# Patient Record
Sex: Female | Born: 1985 | Race: Black or African American | Hispanic: No | Marital: Single | Smoking: Never smoker
Health system: Southern US, Community
[De-identification: ages and names within clinical notes are randomized; demographics above are authoritative.]

## PROBLEM LIST (undated history)

## (undated) DIAGNOSIS — L0291 Cutaneous abscess, unspecified: Secondary | ICD-10-CM

## (undated) HISTORY — PX: TONSILLECTOMY: SUR1361

---
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
## 2007-01-02 ENCOUNTER — Emergency Department: Payer: Self-pay | Admitting: Emergency Medicine

## 2007-12-28 ENCOUNTER — Emergency Department: Payer: Self-pay | Admitting: Emergency Medicine

## 2009-11-05 ENCOUNTER — Emergency Department: Payer: Self-pay | Admitting: Emergency Medicine

## 2009-11-08 ENCOUNTER — Emergency Department: Payer: Self-pay | Admitting: Emergency Medicine

## 2010-04-07 ENCOUNTER — Emergency Department: Payer: Self-pay | Admitting: Emergency Medicine

## 2010-06-27 ENCOUNTER — Emergency Department: Payer: Self-pay | Admitting: Emergency Medicine

## 2011-07-02 ENCOUNTER — Emergency Department: Payer: Self-pay | Admitting: *Deleted

## 2011-07-02 LAB — URINALYSIS, COMPLETE
Ketone: NEGATIVE
Nitrite: NEGATIVE
Ph: 6 (ref 4.5–8.0)
Protein: 100
RBC,UR: 146 /HPF (ref 0–5)
Specific Gravity: 1.013 (ref 1.003–1.030)
WBC UR: 362 /HPF (ref 0–5)

## 2012-03-17 ENCOUNTER — Observation Stay: Payer: Self-pay | Admitting: Surgery

## 2012-03-17 LAB — COMPREHENSIVE METABOLIC PANEL
Albumin: 3.6 g/dL (ref 3.4–5.0)
Alkaline Phosphatase: 102 U/L (ref 50–136)
Anion Gap: 8 (ref 7–16)
BUN: 11 mg/dL (ref 7–18)
Bilirubin,Total: 0.5 mg/dL (ref 0.2–1.0)
Chloride: 105 mmol/L (ref 98–107)
Creatinine: 0.81 mg/dL (ref 0.60–1.30)
EGFR (African American): >60
Glucose: 76 mg/dL (ref 65–99)
Osmolality: 276 (ref 275–301)
Potassium: 3.9 mmol/L (ref 3.5–5.1)
SGOT(AST): 17 U/L (ref 15–37)
Sodium: 139 mmol/L (ref 136–145)
Total Protein: 8.9 g/dL — ABNORMAL HIGH (ref 6.4–8.2)

## 2012-03-17 LAB — URINALYSIS, COMPLETE
Bilirubin,UR: NEGATIVE
Nitrite: NEGATIVE
Ph: 6 (ref 4.5–8.0)
Protein: 30
Specific Gravity: 1.029 (ref 1.003–1.030)
Squamous Epithelial: 17

## 2012-03-17 LAB — CBC
HCT: 39.9 % (ref 35.0–47.0)
HGB: 13 g/dL (ref 12.0–16.0)
MCH: 27.7 pg (ref 26.0–34.0)
MCV: 85 fL (ref 80–100)
Platelet: 405 10*3/uL (ref 150–440)
RBC: 4.69 10*6/uL (ref 3.80–5.20)
RDW: 12.7 % (ref 11.5–14.5)
WBC: 21.3 10*3/uL — ABNORMAL HIGH (ref 3.6–11.0)

## 2012-03-17 LAB — PREGNANCY, URINE: Pregnancy Test, Urine: NEGATIVE m[IU]/mL

## 2012-03-17 LAB — PROTIME-INR: INR: 1.1

## 2013-03-22 ENCOUNTER — Inpatient Hospital Stay: Payer: Self-pay | Admitting: Surgery

## 2013-03-22 LAB — CBC WITH DIFFERENTIAL/PLATELET
Basophil %: 0.2 %
Eosinophil #: 0 10*3/uL (ref 0.0–0.7)
Eosinophil %: 0 %
HCT: 37.8 % (ref 35.0–47.0)
HGB: 12.5 g/dL (ref 12.0–16.0)
Lymphocyte #: 1.7 10*3/uL (ref 1.0–3.6)
Lymphocyte %: 5.5 %
Monocyte #: 3.5 x10 3/mm — ABNORMAL HIGH (ref 0.2–0.9)
Neutrophil #: 26.5 10*3/uL — ABNORMAL HIGH (ref 1.4–6.5)
Neutrophil %: 83.3 %
Platelet: 287 10*3/uL (ref 150–440)
RDW: 13.1 % (ref 11.5–14.5)
WBC: 31.8 10*3/uL — ABNORMAL HIGH (ref 3.6–11.0)

## 2013-03-22 LAB — COMPREHENSIVE METABOLIC PANEL
Alkaline Phosphatase: 102 U/L (ref 50–136)
Anion Gap: 7 (ref 7–16)
Chloride: 103 mmol/L (ref 98–107)
Creatinine: 1.08 mg/dL (ref 0.60–1.30)
EGFR (African American): >60
EGFR (Non-African Amer.): >60
Osmolality: 269 (ref 275–301)
Potassium: 3.3 mmol/L — ABNORMAL LOW (ref 3.5–5.1)
Sodium: 136 mmol/L (ref 136–145)
Total Protein: 8.3 g/dL — ABNORMAL HIGH (ref 6.4–8.2)

## 2013-03-26 LAB — WOUND CULTURE

## 2013-03-27 LAB — CULTURE, BLOOD (SINGLE)

## 2013-05-14 ENCOUNTER — Emergency Department: Payer: Self-pay | Admitting: Emergency Medicine

## 2014-01-18 ENCOUNTER — Emergency Department: Payer: Self-pay | Admitting: Emergency Medicine

## 2014-01-18 LAB — TROPONIN I

## 2014-05-23 ENCOUNTER — Emergency Department: Payer: Self-pay | Admitting: Emergency Medicine

## 2014-05-25 ENCOUNTER — Emergency Department: Payer: Self-pay | Admitting: Emergency Medicine

## 2014-09-13 NOTE — Op Note (Signed)
PATIENT NAME:  Emily Martinez, Emily Martinez MR#:  [id number] DATE OF BIRTH:  1985-07-29  DATE OF PROCEDURE:  03/17/2012  PREOPERATIVE DIAGNOSIS: Perirectal abscess.   POSTOPERATIVE DIAGNOSIS: Perirectal abscess.   PROCEDURE: Incision and drainage of a left perirectal abscess.   SURGEON: Elain Wixon E. Excell Seltzerooper, MD    ANESTHESIA: General with LMA.   INDICATIONS: This is a patient with a progressively larger and increasingly painful mass in the perirectal area on the left. Preoperatively we discussed the rationale for surgery, the options of observation, risks of bleeding, infection, recurrence, open wound, and drain placement. She understood and agreed to proceed.   FINDINGS: Large perirectal abscess on the left buttock.   DESCRIPTION OF PROCEDURE: The patient was induced to general anesthesia and placed into a high candy cane-type lithotomy position and then prepped and draped in a sterile fashion. Examination was performed under anesthesia demonstrating a large perirectal abscess on the left buttock. An incision was made after surgical pause was performed. Purulence exuded. Cultures were taken. Suction was performed to aspirate all the purulence out and it was clearly communicating with the anterior rectum. A Penrose drain was placed into the cavity and held in with a 3-0 Prolene suture and sterile dressings were placed.   The patient tolerated the procedure well. There were no complications. She was taken to the recovery room in stable condition to be admitted for continued care.   ____________________________ Adah Salvageichard E. Excell Seltzerooper, MD rec:drc D: 03/17/2012 23:52:43 ET T: 03/18/2012 08:19:05 ET JOB#: [id number]  cc: Adah Salvageichard E. Excell Seltzerooper, MD, <Dictator> Lattie HawICHARD E Markcus Lazenby MD ELECTRONICALLY SIGNED 03/18/2012 19:17

## 2014-09-13 NOTE — H&P (Signed)
    Subjective/Chief Complaint Fevers, malaise, left buttocks abscess    History of Present Illness Emily Martinez is a pleasant 29 yo F who presents with 5 days of left buttock pain.  She says that she began noticing a slight discomfort to her left buttocks last Thursday.  Since then she has noticed it more significantly.  She says that she now feels a hard mass with a softening in the middle.  No drainage.  Has been having fevers as high as 102.5 which has improved with tylenol.  Has had overall malaise over the weekend.  Able to keep down liquids but has not been taking PO solids.  Last PO liquids approx 4 hours ago.  No chills, night sweats, shortness of breath, cough, abdominal pain, nausea/vomiting, diarrhea/constipation, dysuria/hematuria.  Has never had an abscess before    Past History S/p tonsillectomy   Past Med/Surgical Hx:  Denies medical history:   Tonsillectomy:   ALLERGIES:  NKDA: None    Other Allergies NKDA     Medications Tylenol prn for pain   Family and Social History:   Family History Hypertension  Diabetes Mellitus  Cancer  Breast cancer in maternal GGM, Aunts    Social History negative tobacco, negative ETOH, negative Illicit drugs   Review of Systems:   Subjective/Chief Complaint Perianal/buttock pain, fevers, malaise    Fever/Chills Yes    Cough No    Sputum No    Abdominal Pain No    Diarrhea No    Constipation No    Nausea/Vomiting No    SOB/DOE No    Chest Pain No    Dysuria No    Tolerating Diet Malaise   Physical Exam:   GEN well developed, no acute distress    HEENT pink conjunctivae, PERRL    NECK supple    RESP normal resp effort  clear BS  no use of accessory muscles    CARD regular rate  no murmur  no thrills    ABD denies tenderness  denies Flank Tenderness  no hernia  normal BS  no Adominal Mass    EXTR negative cyanosis/clubbing, negative edema    SKIN normal to palpation, No rashes, No ulcers    PSYCH A+O to time,  place, person, good insight    Additional Comments Large approx 5 x 5 area of induration/erythema, some superficial epidermolysis to medial left buttocks, inferior.  Tender to palpation.  + area of fluctuance in middle.  No spontaneous drainage.     Assessment/Admission Diagnosis 29 yo F with perianal/buttocks abscess, pain, fevers, malaise.    Plan Plan for OR for incision and drainage of buttocks/perianal abscess.  Informed consent obtained.  Given Unasyn x 1 dose.  Will decide if d/c to home vs admission postoperatively.   Electronic Signatures: Jarvis NewcomerLundquist, Quintasia Theroux A (MD)  (Signed 22-Oct-13 16:30)  Authored: CHIEF COMPLAINT and HISTORY, PAST MEDICAL/SURGIAL HISTORY, ALLERGIES, Other Allergies, OTHER MEDICATIONS, FAMILY AND SOCIAL HISTORY, REVIEW OF SYSTEMS, PHYSICAL EXAM, ASSESSMENT AND PLAN   Last Updated: 22-Oct-13 16:30 by Jarvis NewcomerLundquist, Nickolai Rinks A (MD)

## 2014-09-16 NOTE — Op Note (Signed)
PATIENT NAME:  Emily Martinez, Emily Martinez MR#:  [id number] DATE OF BIRTH:  Aug 15, 1985  DATE OF PROCEDURE:  03/22/2013  PREOPERATIVE DIAGNOSIS: Perianal abscess.   POSTOPERATIVE DIAGNOSIS: Perianal abscess.  PROCEDURE PERFORMED: Incision and drainage of perianal abscess.   SURGEON: Tiney Rougealph Ely, Martinez.D.   ANESTHESIA: General.   OPERATIVE PROCEDURE: With the patient in the supine position after induction of appropriate general anesthesia, the patient was placed in lithotomy position, appropriately padded and positioned. The perineal area was prepped with Betadine and draped with sterile towels. The lesion was incised. A very mucoid, watery purulent material was removed. It was cultured. The abscess appeared to track toward the vulva as opposed to toward the rectum. I cannot identify a rectal connection. The area was carefully probed and I could not identify connection to the rectum. Counter incision was made beside the left labial majora and Penrose drain placed through that area and secured with 3-0 nylon. Sterile dressing was applied. The patient was awakened and returned to the recovery room in the company of the nurse anesthetist. Sponge, instrument and needle counts were correct x 2 in the operating room.   ____________________________ Quentin Orealph L. Ely III, MD rle:sg D: 03/22/2013 22:26:42 ET T: 03/23/2013 07:10:35 ET JOB#: [id number]  cc: Quentin Orealph L. Ely III, MD, <Dictator> Quentin OreALPH L ELY MD ELECTRONICALLY SIGNED 03/23/2013 20:35

## 2014-09-16 NOTE — H&P (Signed)
   Subjective/Chief Complaint perirectal pain and swelling   History of Present Illness 3 days worsening pain and swelling. No f/c. prior episode 2013 with I&D at [facility]. spont drainage once before   Past History PMH none [facility] I&D   Past Med/Surgical Hx:  Denies medical history:   Tonsillectomy:   ALLERGIES:  NKDA: None  Family and Social History:  Family History Non-Contributory   Social History negative tobacco, negative ETOH, healthcare   Place of Living Home   Review of Systems:  Fever/Chills No   Cough No   Sputum No   Abdominal Pain No   Diarrhea No   Constipation No   Nausea/Vomiting No   SOB/DOE No   Chest Pain No   Dysuria No   Tolerating Diet Yes  ate at 1300   Medications/Allergies Reviewed Medications/Allergies reviewed   Physical Exam:  GEN no acute distress   HEENT pink conjunctivae   NECK supple   RESP normal resp effort  clear BS  no use of accessory muscles   CARD regular rate   ABD denies tenderness  soft   LYMPH negative neck   EXTR negative edema   SKIN left perirectal area with large swelling, tenderness   PSYCH alert, A+O to time, place, person, good insight   Lab Results: Hepatic:  27-Oct-14 13:58   Bilirubin, Total 0.4  Alkaline Phosphatase 102  SGPT (ALT) 22  SGOT (AST) 16  Total Protein, Serum  8.3  Albumin, Serum 3.7  Routine Chem:  27-Oct-14 13:58   Glucose, Serum 96  BUN  6  Creatinine (comp) 1.08  Sodium, Serum 136  Potassium, Serum  3.3  Chloride, Serum 103  CO2, Serum 26  Calcium (Total), Serum 9.5  Osmolality (calc) 269  eGFR (African American) >60  eGFR (Non-African American) >60 (eGFR values <78m/min/1.73 m2 may be an indication of chronic kidney disease (CKD). Calculated eGFR is useful in patients with stable renal function. The eGFR calculation will not be reliable in acutely ill patients when serum creatinine is changing rapidly. It is not useful in  patients on dialysis. The eGFR  calculation may not be applicable to patients at the low and high extremes of body sizes, pregnant women, and vegetarians.)  Anion Gap 7  Routine Hem:  27-Oct-14 13:58   WBC (CBC)  31.8  RBC (CBC) 4.51  Hemoglobin (CBC) 12.5  Hematocrit (CBC) 37.8  Platelet Count (CBC) 287  MCV 84  MCH 27.8  MCHC 33.2  RDW 13.1  Neutrophil % 83.3  Lymphocyte % 5.5  Monocyte % 11.0  Eosinophil % 0.0  Basophil % 0.2  Neutrophil #  26.5  Lymphocyte # 1.7  Monocyte #  3.5  Eosinophil # 0.0  Basophil # 0.1 (Result(s) reported on 22 Mar 2013 at 02:46PM.)    Assessment/Admission Diagnosis perirectal abscess rec I&D in OR risks and options in detail agrees with plan ate lunch prior to coming to ED about 1300. will make NPO and proceed this eve.   Electronic Signatures: CFlorene Glen(MD)  (Signed 27-Oct-14 15:36)  Authored: CHIEF COMPLAINT and HISTORY, PAST MEDICAL/SURGIAL HISTORY, ALLERGIES, FAMILY AND SOCIAL HISTORY, REVIEW OF SYSTEMS, PHYSICAL EXAM, LABS, ASSESSMENT AND PLAN   Last Updated: 27-Oct-14 15:36 by CFlorene Glen(MD)

## 2014-09-16 NOTE — Consult Note (Signed)
PATIENT NAME:  Emily Martinez, [name] MR#:  [id number] DATE OF BIRTH:  03-Jun-1985  DATE OF CONSULTATION:  03/22/2013  CONSULTING PHYSICIAN:  Adah Salvageichard E. Excell Seltzerooper, MD  CHIEF COMPLAINT: Perirectal pain.   HISTORY OF PRESENT ILLNESS: This is a patient with a prior history of a perirectal abscess requiring incision and drainage in the operating room who presents a year and a half later with increasing swelling and pain over the last 3 days. She has had no fevers or chills. She had an episode like this several months ago that spontaneously drained and did not require any further intervention. She now presents with increasing pain and obvious swelling suggestive of perirectal abscess.   PAST MEDICAL HISTORY: None.   PAST SURGICAL HISTORY: Tonsillectomy, adenoidectomy, incision and drainage of perirectal abscess.   FAMILY HISTORY: Noncontributory.   SOCIAL HISTORY: The patient does not smoke or drink. Works in health care.   REVIEW OF SYSTEMS: A 10-system review is performed and negative with the exception of that mentioned in the HPI.   PHYSICAL EXAMINATION:  GENERAL: Healthy female patient in no acute distress.  VITAL SIGNS: Temperature 98.7, pulse 110, respirations 20, blood pressure 136/88,  pain scale of 10, and 98% room air saturation.  HEENT: No scleral icterus.  NECK: No palpable neck nodes.  CHEST: Clear to auscultation.  CARDIAC: Regular rate and rhythm.  ABDOMEN: Soft and nontender.   EXTREMITIES: Without edema.  NEUROLOGIC: Grossly intact.  INTEGUMENT: No jaundice.  RECTAL: Perirectal area demonstrates a large swelling in the left buttock with some erythema,  thin, fluctuant area with likely abscess.   LABORATORY VALUES: White blood cell count is 32,000, H and H 12.5 and 37.8, and a platelet count of 287. Potassium is 3.; otherwise, normal electrolytes.   ASSESSMENT AND PLAN: I spoke to Dr. Cyril LoosenKinner in the Emergency Room concerning this patient's condition, and she was personally  examined and history taken. She has a perirectal abscess. She has had this happen before at least on 2 occasions, one of which required a visit to the operating room for a formal incision and drainage. I have recommended starting IV antibiotics, taking her to the operating room tonight. She ate lunch just prior to coming to the Emergency Room at approximately 1300 hours; therefore, she will need to be kept n.p.o. and surgery will need to be performed this evening. Should be admitted to the hospital, hydrated, and IV antibiotics started.   The risks of bleeding, infection, recurrence, and scar were all discussed with her. She understood and agreed to proceed.    ____________________________ Adah Salvageichard E. Excell Seltzerooper, MD rec:np D: 03/22/2013 15:39:50 ET T: 03/22/2013 16:21:57 ET JOB#: [id number]  cc: Adah Salvageichard E. Excell Seltzerooper, MD, <Dictator> Lattie HawICHARD E Brandilyn Nanninga MD ELECTRONICALLY SIGNED 03/22/2013 16:56

## 2014-11-08 ENCOUNTER — Emergency Department
Admission: EM | Admit: 2014-11-08 | Discharge: 2014-11-08 | Disposition: A | Discharge status: Home / Self Care | Payer: Self-pay | Attending: Emergency Medicine | Admitting: Emergency Medicine

## 2014-11-08 ENCOUNTER — Encounter: Payer: Self-pay | Admitting: Emergency Medicine

## 2014-11-08 DIAGNOSIS — B9689 Other specified bacterial agents as the cause of diseases classified elsewhere: Secondary | ICD-10-CM

## 2014-11-08 DIAGNOSIS — L089 Local infection of the skin and subcutaneous tissue, unspecified: Secondary | ICD-10-CM | POA: Insufficient documentation

## 2014-11-08 HISTORY — DX: Cutaneous abscess, unspecified: L02.91

## 2014-11-08 MED ORDER — OXYCODONE-ACETAMINOPHEN 7.5-325 MG PO TABS: 1.0000 | ORAL_TABLET | Freq: Four times a day (QID) | ORAL | Status: AC | PRN

## 2014-11-08 MED ORDER — SULFAMETHOXAZOLE-TRIMETHOPRIM 800-160 MG PO TABS: 1.0000 | ORAL_TABLET | Freq: Two times a day (BID) | ORAL | Status: AC

## 2014-11-08 NOTE — ED Notes (Signed)
States she developed an abscess area to left buttocks couple of days ago  History of same

## 2014-11-08 NOTE — ED Provider Notes (Signed)
[facility] Emergency Department Provider Note  ____________________________________________  Time seen: Approximately 10:08 AM  I have reviewed the triage vital signs and the nursing notes.   HISTORY  Chief Complaint Abscess    HPI Emily Martinez is a 29 y.o. female reports ER today with complaint of a mass on the left buttocks this been there for couple days. Patient state this is not had any drainage at this time. Patient has a history of having skin abscesses the last one was greater than 6 months ago. She denies any fever chills at this time again is no discharge area is nonfluctuant.Patient rates her pain discomfort as 8/10.   Past Medical History  Diagnosis Date  . Abscess unk    There are no active problems to display for this patient.   History reviewed. No pertinent past surgical history.  No current outpatient prescriptions on file.  Allergies Review of patient's allergies indicates no known allergies.  No family history on file.  Social History History  Substance Use Topics  . Smoking status: Never Smoker   . Smokeless tobacco: Not on file  . Alcohol Use: No    Review of Systems Constitutional: No fever/chills Eyes: No visual changes. ENT: No sore throat. Cardiovascular: Denies chest pain. Respiratory: Denies shortness of breath. Gastrointestinal: No abdominal pain.  No nausea, no vomiting.  No diarrhea.  No constipation. Genitourinary: Negative for dysuria. Musculoskeletal: Negative for back pain. Skin: Numerous area left buttocks.. Neurological: Negative for headaches, focal weakness or numbness. Allergic/Immunilogical: **} 10-point ROS otherwise negative.  ____________________________________________   PHYSICAL EXAM:  VITAL SIGNS: ED Triage Vitals  Enc Vitals Group     BP 11/08/14 1007 128/90 mmHg     Pulse Rate 11/08/14 1007 82     Resp 11/08/14 1007 20     Temp 11/08/14 1007 98.2 F (36.8 C)     Temp  Source 11/08/14 1007 Oral     SpO2 11/08/14 1007 98 %     Weight 11/08/14 0942 180 lb (81.647 kg)     Height 11/08/14 0942  (1.575 m)     Head Cir --      Peak Flow --      Pain Score 11/08/14 0939 8     Pain Loc --      Pain Edu? --      Excl. in GC? --    Constitutional: Alert and oriented. Well appearing and in no acute distress. Eyes: Conjunctivae are normal. PERRL. EOMI. Head: Atraumatic. Nose: No congestion/rhinnorhea. Mouth/Throat: Mucous membranes are moist.  Oropharynx non-erythematous. Neck: No stridor.  No deformity for nuchal range of motion nontender palpation. Hematological/Lymphatic/Immunilogical: No cervical lymphadenopathy. Cardiovascular: Normal rate, regular rhythm. Grossly normal heart sounds.  Good peripheral circulation. Respiratory: Normal respiratory effort.  No retractions. Lungs CTAB. Gastrointestinal: Soft and nontender. No distention. No abdominal bruits. No CVA tenderness. Musculoskeletal: No lower extremity tenderness nor edema.  No joint effusions. Neurologic:  Normal speech and language. No gross focal neurologic deficits are appreciated. Speech is normal. No gait instability. Skin:  Skin is warm, dry and intact. No rash noted. Nodular lesion left buttocks with mild erythema. Psychiatric: Mood and affect are normal. Speech and behavior are normal.  ____________________________________________   LABS (all labs ordered are listed, but only abnormal results are displayed)  Labs Reviewed - No data to display ____________________________________________  EKG   ____________________________________________  RADIOLOGY   ____________________________________________   PROCEDURES  Procedure(s) performed: None  Critical Care performed: No  ____________________________________________   INITIAL IMPRESSION / ASSESSMENT AND PLAN / ED COURSE  Pertinent labs & imaging results that were available during my care of the patient were reviewed by  me and considered in my medical decision making (see chart for details).  Skin infection left buttocks. ____________________________________________   FINAL CLINICAL IMPRESSION(S) / ED DIAGNOSES  Final diagnoses:  Bacterial skin infection      Joni Reining, PA-C 11/08/14 1013  Sharman Cheek, MD 11/08/14 [phone]

## 2015-02-08 ENCOUNTER — Encounter: Payer: Self-pay | Admitting: Emergency Medicine

## 2015-02-08 ENCOUNTER — Emergency Department
Admission: EM | Admit: 2015-02-08 | Discharge: 2015-02-08 | Disposition: A | Discharge status: Home / Self Care | Payer: 59 | Attending: Emergency Medicine | Admitting: Emergency Medicine

## 2015-02-08 DIAGNOSIS — Z79899 Other long term (current) drug therapy: Secondary | ICD-10-CM | POA: Insufficient documentation

## 2015-02-08 DIAGNOSIS — R112 Nausea with vomiting, unspecified: Secondary | ICD-10-CM

## 2015-02-08 DIAGNOSIS — K219 Gastro-esophageal reflux disease without esophagitis: Secondary | ICD-10-CM | POA: Diagnosis not present

## 2015-02-08 DIAGNOSIS — Z3202 Encounter for pregnancy test, result negative: Secondary | ICD-10-CM | POA: Insufficient documentation

## 2015-02-08 LAB — COMPREHENSIVE METABOLIC PANEL
ALBUMIN: 4 g/dL (ref 3.5–5.0)
ALT: 13 U/L — AB (ref 14–54)
AST: 16 U/L (ref 15–41)
Alkaline Phosphatase: 57 U/L (ref 38–126)
Anion gap: 4 — ABNORMAL LOW (ref 5–15)
BUN: 11 mg/dL (ref 6–20)
CHLORIDE: 110 mmol/L (ref 101–111)
CO2: 27 mmol/L (ref 22–32)
CREATININE: 0.92 mg/dL (ref 0.44–1.00)
Calcium: 9.3 mg/dL (ref 8.9–10.3)
GFR calc non Af Amer: >60 mL/min (ref >60)
Glucose, Bld: 102 mg/dL — ABNORMAL HIGH (ref 65–99)
Potassium: 3.9 mmol/L (ref 3.5–5.1)
SODIUM: 141 mmol/L (ref 135–145)
Total Bilirubin: 0.7 mg/dL (ref 0.3–1.2)
Total Protein: 7.2 g/dL (ref 6.5–8.1)

## 2015-02-08 LAB — URINALYSIS COMPLETE WITH MICROSCOPIC (ARMC ONLY)
Bacteria, UA: NONE SEEN
Bilirubin Urine: NEGATIVE
Glucose, UA: NEGATIVE mg/dL
Hgb urine dipstick: NEGATIVE
Ketones, ur: NEGATIVE mg/dL
Leukocytes, UA: NEGATIVE
Nitrite: NEGATIVE
Protein, ur: NEGATIVE mg/dL
Specific Gravity, Urine: 1.016 (ref 1.005–1.030)
pH: 6 (ref 5.0–8.0)

## 2015-02-08 LAB — CBC
HCT: 38.6 % (ref 35.0–47.0)
HEMOGLOBIN: 12.5 g/dL (ref 12.0–16.0)
MCH: 27.2 pg (ref 26.0–34.0)
MCHC: 32.4 g/dL (ref 32.0–36.0)
MCV: 83.8 fL (ref 80.0–100.0)
PLATELETS: 350 10*3/uL (ref 150–440)
RBC: 4.6 MIL/uL (ref 3.80–5.20)
RDW: 13.4 % (ref 11.5–14.5)
WBC: 6.9 10*3/uL (ref 3.6–11.0)

## 2015-02-08 LAB — LIPASE, BLOOD: Lipase: 20 U/L — ABNORMAL LOW (ref 22–51)

## 2015-02-08 LAB — POCT PREGNANCY, URINE: Preg Test, Ur: NEGATIVE

## 2015-02-08 MED ORDER — ONDANSETRON HCL 4 MG PO TABS: ORAL_TABLET | ORAL | Status: AC

## 2015-02-08 MED ORDER — OMEPRAZOLE MAGNESIUM 20 MG PO TBEC: 20.0000 mg | DELAYED_RELEASE_TABLET | Freq: Every day | ORAL | Status: AC

## 2015-02-08 NOTE — Discharge Instructions (Signed)
As we discussed, we believe your symptoms are a result of acid reflux.  We recommend that you try to elevate the head of your bed or sleep on a couple of pillows.  Try not to eat within several hours before he go to bed.  Try an over-the-counter medications such as omeprazole and take it according to label instructions.  Follow-up with a gastroenterologist as recommended within the next week to see if the medication is helping you.  Return to the emergency department with new or worsening symptoms that concern you.   Gastroesophageal Reflux Disease, Adult Gastroesophageal reflux disease (GERD) happens when acid from your stomach flows up into the esophagus. When acid comes in contact with the esophagus, the acid causes soreness (inflammation) in the esophagus. Over time, GERD may create small holes (ulcers) in the lining of the esophagus. CAUSES   Increased body weight. This puts pressure on the stomach, making acid rise from the stomach into the esophagus.  Smoking. This increases acid production in the stomach.  Drinking alcohol. This causes decreased pressure in the lower esophageal sphincter (valve or ring of muscle between the esophagus and stomach), allowing acid from the stomach into the esophagus.  Late evening meals and a full stomach. This increases pressure and acid production in the stomach.  A malformed lower esophageal sphincter. Sometimes, no cause is found. SYMPTOMS   Burning pain in the lower part of the mid-chest behind the breastbone and in the mid-stomach area. This may occur twice a week or more often.  Trouble swallowing.  Sore throat.  Dry cough.  Asthma-like symptoms including chest tightness, shortness of breath, or wheezing. DIAGNOSIS  Your caregiver may be able to diagnose GERD based on your symptoms. In some cases, X-rays and other tests may be done to check for complications or to check the condition of your stomach and esophagus. TREATMENT  Your caregiver  may recommend over-the-counter or prescription medicines to help decrease acid production. Ask your caregiver before starting or adding any new medicines.  HOME CARE INSTRUCTIONS   Change the factors that you can control. Ask your caregiver for guidance concerning weight loss, quitting smoking, and alcohol consumption.  Avoid foods and drinks that make your symptoms worse, such as:  Caffeine or alcoholic drinks.  Chocolate.  Peppermint or mint flavorings.  Garlic and onions.  Spicy foods.  Citrus fruits, such as oranges, lemons, or limes.  Tomato-based foods such as sauce, chili, salsa, and pizza.  Fried and fatty foods.  Avoid lying down for the 3 hours prior to your bedtime or prior to taking a nap.  Eat small, frequent meals instead of large meals.  Wear loose-fitting clothing. Do not wear anything tight around your waist that causes pressure on your stomach.  Raise the head of your bed 6 to 8 inches with wood blocks to help you sleep. Extra pillows will not help.  Only take over-the-counter or prescription medicines for pain, discomfort, or fever as directed by your caregiver.  Do not take aspirin, ibuprofen, or other nonsteroidal anti-inflammatory drugs (NSAIDs). SEEK IMMEDIATE MEDICAL CARE IF:   You have pain in your arms, neck, jaw, teeth, or back.  Your pain increases or changes in intensity or duration.  You develop nausea, vomiting, or sweating (diaphoresis).  You develop shortness of breath, or you faint.  Your vomit is green, yellow, black, or looks like coffee grounds or blood.  Your stool is red, bloody, or black. These symptoms could be signs of other problems, such  as heart disease, gastric bleeding, or esophageal bleeding. MAKE SURE YOU:   Understand these instructions.  Will watch your condition.  Will get help right away if you are not doing well or get worse. Document Released: 02/20/2005 Document Revised: 08/05/2011 Document Reviewed:  11/30/2010 [facility] Patient Information 2015 [place], [place]. This information is not intended to replace advice given to you by your health care provider. Make sure you discuss any questions you have with your health care provider.  Food Choices for Gastroesophageal Reflux Disease When you have gastroesophageal reflux disease (GERD), the foods you eat and your eating habits are very important. Choosing the right foods can help ease your discomfort.  WHAT GUIDELINES DO I NEED TO FOLLOW?   Choose fruits, vegetables, whole grains, and low-fat dairy products.   Choose low-fat meat, fish, and poultry.  Limit fats such as oils, salad dressings, butter, nuts, and avocado.   Keep a food diary. This helps you identify foods that cause symptoms.   Avoid foods that cause symptoms. These may be different for everyone.   Eat small meals often instead of 3 large meals a day.   Eat your meals slowly, in a place where you are relaxed.   Limit fried foods.   Cook foods using methods other than frying.   Avoid drinking alcohol.   Avoid drinking large amounts of liquids with your meals.   Avoid bending over or lying down until 2-3 hours after eating.  WHAT FOODS ARE NOT RECOMMENDED?  These are some foods and drinks that may make your symptoms worse: Vegetables Tomatoes. Tomato juice. Tomato and spaghetti sauce. Chili peppers. Onion and garlic. Horseradish. Fruits Oranges, grapefruit, and lemon (fruit and juice). Meats High-fat meats, fish, and poultry. This includes hot dogs, ribs, ham, sausage, salami, and bacon. Dairy Whole milk and chocolate milk. Sour cream. Cream. Butter. Ice cream. Cream cheese.  Drinks Coffee and tea. Bubbly (carbonated) drinks or energy drinks. Condiments Hot sauce. Barbecue sauce.  Sweets/Desserts Chocolate and cocoa. Donuts. Peppermint and spearmint. Fats and Oils High-fat foods. This includes [place] fries and potato chips. Other Vinegar. Strong  spices. This includes black pepper, white pepper, red pepper, cayenne, curry powder, cloves, ginger, and chili powder. The items listed above may not be a complete list of foods and drinks to avoid. Contact your dietitian for more information. Document Released: 11/12/2011 Document Revised: 05/18/2013 Document Reviewed: 03/17/2013 [facility] Patient Information 2015 [place], [place]. This information is not intended to replace advice given to you by your health care provider. Make sure you discuss any questions you have with your health care provider.

## 2015-02-08 NOTE — ED Notes (Signed)
Vomiting for about 1 week.  Says initally diarrhea, but not now.  Says she feels like unable to digest food.

## 2015-02-08 NOTE — ED Provider Notes (Signed)
[facility] Emergency Department Provider Note  ____________________________________________  Time seen: Approximately 3:14 PM  I have reviewed the triage vital signs and the nursing notes.   HISTORY  Chief Complaint Emesis    HPI Emily Martinez is a 29 y.o. female with a history of obesity and is otherwise healthy who presents with about 1 week of nausea/vomiting in the morning and the feeling of her food "not going down".  She had a couple of days of loose stools, but that has resolved.  She denies chest pain, SOB, abd pain other than some mild tenderness in the epigastrium, dysuria, and fever/chills.  She states that her symptoms are always worse in the morning when she wakes up and that she feels like the food from the night before has not gone down.  She has not seen any blood in her vomit.  She has not had any weakness, lightheadedness, dizziness.  Nothing makes symptoms better but nothing makes them worse.  She does not take any medications for acid reflux.    Past Medical History  Diagnosis Date  . Abscess unk    There are no active problems to display for this patient.   Past Surgical History  Procedure Laterality Date  . Tonsillectomy      Current Outpatient Rx  Name  Route  Sig  Dispense  Refill  . omeprazole (PRILOSEC OTC) 20 MG tablet   Oral   Take 1 tablet (20 mg total) by mouth daily.   28 tablet   1   . ondansetron (ZOFRAN) 4 MG tablet      Take 1-2 tabs by mouth every 8 hours as needed for nausea/vomiting   30 tablet   0   . oxyCODONE-acetaminophen (PERCOCET) 7.5-325 MG per tablet   Oral   Take 1 tablet by mouth every 6 (six) hours as needed for severe pain.   12 tablet   0   . sulfamethoxazole-trimethoprim (BACTRIM DS,SEPTRA DS) 800-160 MG per tablet   Oral   Take 1 tablet by mouth 2 (two) times daily.   20 tablet   0     Allergies Review of patient's allergies indicates no known allergies.  No family  history on file.  Social History Social History  Substance Use Topics  . Smoking status: Never Smoker   . Smokeless tobacco: None  . Alcohol Use: No    Review of Systems Constitutional: No fever/chills Eyes: No visual changes. ENT: No sore throat. Cardiovascular: Denies chest pain. Respiratory: Denies shortness of breath. Gastrointestinal: Nausea  Genitourinary: Negative for dysuria. Musculoskeletal: Negative for back pain. Skin: Negative for rash. Neurological: Negative for headaches, focal weakness or numbness.  10-point ROS otherwise negative.  ____________________________________________   PHYSICAL EXAM:  VITAL SIGNS: ED Triage Vitals  Enc Vitals Group     BP 02/08/15 1033 140/101 mmHg     Pulse Rate 02/08/15 1033 71     Resp 02/08/15 1033 16     Temp 02/08/15 1033 97.9 F (36.6 C)     Temp Source 02/08/15 1033 Oral     SpO2 02/08/15 1033 98 %     Weight 02/08/15 1036 183 lb 1 oz (83.037 kg)     Height 02/08/15 1033 5\' 2"  (1.575 m)     Head Cir --      Peak Flow --      Pain Score 02/08/15 1034 7     Pain Loc --      Pain Edu? --  Excl. in GC? --     Constitutional: Alert and oriented. Well appearing and in no acute distress. Eyes: Conjunctivae are normal. PERRL. EOMI. Head: Atraumatic. Nose: No congestion/rhinnorhea. Mouth/Throat: Mucous membranes are moist.  Oropharynx non-erythematous. Neck: No stridor.   Cardiovascular: Normal rate, regular rhythm. Grossly normal heart sounds.  Good peripheral circulation. Respiratory: Normal respiratory effort.  No retractions. Lungs CTAB. Gastrointestinal: Obese.  Soft and nontender. No distention. No abdominal bruits. No CVA tenderness. Musculoskeletal: No lower extremity tenderness nor edema.  No joint effusions. Neurologic:  Normal speech and language. No gross focal neurologic deficits are appreciated.  Skin:  Skin is warm, dry and intact. No rash noted. Psychiatric: Mood and affect are normal. Speech and  behavior are normal.  ____________________________________________   LABS (all labs ordered are listed, but only abnormal results are displayed)  Labs Reviewed  LIPASE, BLOOD - Abnormal; Notable for the following:    Lipase 20 (*)    All other components within normal limits  COMPREHENSIVE METABOLIC PANEL - Abnormal; Notable for the following:    Glucose, Bld 102 (*)    ALT 13 (*)    Anion gap 4 (*)    All other components within normal limits  URINALYSIS COMPLETEWITH MICROSCOPIC ([facility] ONLY) - Abnormal; Notable for the following:    Color, Urine YELLOW (*)    APPearance CLEAR (*)    Squamous Epithelial / LPF 6-30 (*)    All other components within normal limits  CBC  POC URINE PREG, ED  POCT PREGNANCY, URINE   ____________________________________________  EKG  Not indicated ____________________________________________  RADIOLOGY   No results found.  ____________________________________________   PROCEDURES  Procedure(s) performed: None  Critical Care performed: No ____________________________________________   INITIAL IMPRESSION / ASSESSMENT AND PLAN / ED COURSE  Pertinent labs & imaging results that were available during my care of the patient were reviewed by me and considered in my medical decision making (see chart for details).  The patient has normal vital signs and a reassuring lab workup including a negative pregnancy test.  I believe her symptoms are likely secondary to acid reflux.  We had an extensive discussion about this and I gave her my usual and customary recommendations for treatment and follow-up.  She understands and agrees with this plan.  ____________________________________________  FINAL CLINICAL IMPRESSION(S) / ED DIAGNOSES  Final diagnoses:  Non-intractable vomiting with nausea, vomiting of unspecified type  Gastroesophageal reflux disease, esophagitis presence not specified      NEW MEDICATIONS STARTED DURING THIS  VISIT:  New Prescriptions   OMEPRAZOLE (PRILOSEC OTC) 20 MG TABLET    Take 1 tablet (20 mg total) by mouth daily.   ONDANSETRON (ZOFRAN) 4 MG TABLET    Take 1-2 tabs by mouth every 8 hours as needed for nausea/vomiting     Loleta Rose, MD 02/08/15 1539

## 2016-02-08 ENCOUNTER — Emergency Department
Admission: EM | Admit: 2016-02-08 | Discharge: 2016-02-08 | Disposition: A | Discharge status: Home / Self Care | Payer: 59 | Attending: Emergency Medicine | Admitting: Emergency Medicine

## 2016-02-08 ENCOUNTER — Encounter: Payer: Self-pay | Admitting: Emergency Medicine

## 2016-02-08 ENCOUNTER — Emergency Department: Payer: 59

## 2016-02-08 DIAGNOSIS — Z79899 Other long term (current) drug therapy: Secondary | ICD-10-CM | POA: Insufficient documentation

## 2016-02-08 DIAGNOSIS — N939 Abnormal uterine and vaginal bleeding, unspecified: Secondary | ICD-10-CM

## 2016-02-08 LAB — CBC WITH DIFFERENTIAL/PLATELET
BASOS PCT: 0 %
Basophils Absolute: 0 10*3/uL (ref 0–0.1)
EOS ABS: 0.1 10*3/uL (ref 0–0.7)
EOS PCT: 1 %
HCT: 39.3 % (ref 35.0–47.0)
Hemoglobin: 13.1 g/dL (ref 12.0–16.0)
Lymphocytes Relative: 28 %
Lymphs Abs: 3.3 10*3/uL (ref 1.0–3.6)
MCH: 30.3 pg (ref 26.0–34.0)
MCHC: 33.4 g/dL (ref 32.0–36.0)
MCV: 90.7 fL (ref 80.0–100.0)
MONO ABS: 1.2 10*3/uL — AB (ref 0.2–0.9)
MONOS PCT: 10 %
NEUTROS PCT: 61 %
Neutro Abs: 7.2 10*3/uL — ABNORMAL HIGH (ref 1.4–6.5)
PLATELETS: 372 10*3/uL (ref 150–440)
RBC: 4.33 MIL/uL (ref 3.80–5.20)
RDW: 13.4 % (ref 11.5–14.5)
WBC: 11.8 10*3/uL — ABNORMAL HIGH (ref 3.6–11.0)

## 2016-02-08 LAB — POCT PREGNANCY, URINE: Preg Test, Ur: NEGATIVE

## 2016-02-08 NOTE — ED Notes (Signed)
Blood draw was done in triage. Lab confirmed lavender tube was available.

## 2016-02-08 NOTE — ED Triage Notes (Signed)
C/o vaginal bleeding x 16 days. Skin w/d with good color

## 2016-02-08 NOTE — ED Notes (Signed)
Patient transported to Ultrasound 

## 2016-02-08 NOTE — Discharge Instructions (Signed)
Please seek medical attention for any high fevers, chest pain, shortness of breath, change in behavior, persistent vomiting, bloody stool or any other new or concerning symptoms.  

## 2016-02-08 NOTE — ED Provider Notes (Signed)
[facility] Emergency Department Provider Note    ____________________________________________   I have reviewed the triage vital signs and the nursing notes.   HISTORY  Chief Complaint Vaginal Bleeding   History limited by: Not Limited   HPI Emily Martinez is a 30 y.o. female who presents to the emergency department today because of concerns for vaginal bleeding. The patient states that she has been having vaginal bleeding for the past 16 days. She has a history of polycystic ovarian syndrome states that she usually has irregular menses however they typically only last about 5 days. It is unusual for her to have bleeding for this long. Denies any significant pain with this. Has not noticed any chest pain or shortness breath.      Past Medical History:  Diagnosis Date  . Abscess unk    There are no active problems to display for this patient.   Past Surgical History:  Procedure Laterality Date  . TONSILLECTOMY      Prior to Admission medications   Medication Sig Start Date End Date Taking? Authorizing Provider  omeprazole (PRILOSEC OTC) 20 MG tablet Take 1 tablet (20 mg total) by mouth daily. 02/08/15 02/08/16  Loleta Rose, MD  ondansetron (ZOFRAN) 4 MG tablet Take 1-2 tabs by mouth every 8 hours as needed for nausea/vomiting 02/08/15   Loleta Rose, MD  oxyCODONE-acetaminophen (PERCOCET) 7.5-325 MG per tablet Take 1 tablet by mouth every 6 (six) hours as needed for severe pain. 11/08/14   Joni Reining, PA-C  sulfamethoxazole-trimethoprim (BACTRIM DS,SEPTRA DS) 800-160 MG per tablet Take 1 tablet by mouth 2 (two) times daily. 11/08/14   Joni Reining, PA-C    Allergies Review of patient's allergies indicates no known allergies.  No family history on file.  Social History Social History  Substance Use Topics  . Smoking status: Never Smoker  . Smokeless tobacco: Never Used  . Alcohol use No    Review of Systems  Constitutional:  Negative for fever. Cardiovascular: Negative for chest pain. Respiratory: Negative for shortness of breath. Gastrointestinal: Negative for abdominal pain, vomiting and diarrhea. Genitourinary: Positive for vaginal bleeding. Musculoskeletal: Negative for back pain. Skin: Negative for rash. Neurological: Negative for headaches, focal weakness or numbness.   10-point ROS otherwise negative.  ____________________________________________   PHYSICAL EXAM:  VITAL SIGNS: ED Triage Vitals  Enc Vitals Group     BP 02/08/16 1812 (!) 148/101     Pulse Rate 02/08/16 1812 87     Resp 02/08/16 1812 16     Temp 02/08/16 1812 98.3 F (36.8 C)     Temp Source 02/08/16 1812 Oral     SpO2 02/08/16 1812 100 %     Weight 02/08/16 1813 182 lb (82.6 kg)     Height 02/08/16 1813 5\' 2"  (1.575 m)     Head Circumference --      Peak Flow --      Pain Score 02/08/16 1813 1   Constitutional: Alert and oriented. Well appearing and in no distress. Eyes: Conjunctivae are normal. Normal extraocular movements. ENT   Head: Normocephalic and atraumatic.   Nose: No congestion/rhinnorhea.   Mouth/Throat: Mucous membranes are moist.   Neck: No stridor. Hematological/Lymphatic/Immunilogical: No cervical lymphadenopathy. Cardiovascular: Normal rate, regular rhythm.  No murmurs, rubs, or gallops. Respiratory: Normal respiratory effort without tachypnea nor retractions. Breath sounds are clear and equal bilaterally. No wheezes/rales/rhonchi. Gastrointestinal: Soft and nontender. No distention.  Genitourinary: Deferred Musculoskeletal: Normal range of motion in all extremities.  No lower extremity edema. Neurologic:  Normal speech and language. No gross focal neurologic deficits are appreciated.  Skin:  Skin is warm, dry and intact. No rash noted. Psychiatric: Mood and affect are normal. Speech and behavior are normal. Patient exhibits appropriate insight and  judgment.  ____________________________________________    LABS (pertinent positives/negatives)  Labs Reviewed  POCT PREGNANCY, URINE     ____________________________________________   EKG  None  ____________________________________________    RADIOLOGY  US   IMPRESSION:  Unremarkable pelvic ultrasound.       ____________________________________________   PROCEDURES  Procedures  ____________________________________________   INITIAL IMPRESSION / ASSESSMENT AND PLAN / ED COURSE  Pertinent labs & imaging results that were available during my care of the patient were reviewed by me and considered in my medical decision making (see chart for details).  Patient presented to the emergency department today with concerns for 16 day history of vaginal bleeding. Vital signs here without any hypotension or tachycardia. Hemoglobin within normal limits. Will obtain ultrasound to evaluate for concerning mass or cause of bleeding.  Clinical Course   US without any concerning findings. Will have patient follow up with ob/gyn ____________________________________________   FINAL CLINICAL IMPRESSION(S) / ED DIAGNOSES  Final diagnoses:  Vaginal bleeding     Note: This dictation was prepared with Dragon dictation. Any transcriptional errors that result from this process are unintentional    Phineas SemenGraydon Muhamad Serano, MD 02/09/16 1754

## 2016-02-08 NOTE — ED Notes (Signed)
Discharge instructions reviewed with patient. Questions fielded by this RN. Patient verbalizes understanding of instructions. Patient discharged home in stable condition per Goodman MD . No acute distress noted at time of discharge.   

## 2016-10-31 ENCOUNTER — Telehealth: Payer: Self-pay | Admitting: Obstetrics and Gynecology

## 2016-10-31 NOTE — Telephone Encounter (Signed)
Pt is calling about her 2017 account. In [employer] I am seeing 17.66 but I am unable to find out if she has a balance in [employer]. Could you please look into this .

## 2016-11-20 ENCOUNTER — Ambulatory Visit: Payer: Self-pay | Admitting: Obstetrics and Gynecology

## 2017-04-05 ENCOUNTER — Emergency Department
Admission: EM | Admit: 2017-04-05 | Discharge: 2017-04-05 | Disposition: A | Discharge status: Home / Self Care | Payer: 59 | Attending: Emergency Medicine | Admitting: Emergency Medicine

## 2017-04-05 ENCOUNTER — Encounter: Payer: Self-pay | Admitting: Emergency Medicine

## 2017-04-05 ENCOUNTER — Other Ambulatory Visit: Payer: Self-pay

## 2017-04-05 DIAGNOSIS — S46811A Strain of other muscles, fascia and tendons at shoulder and upper arm level, right arm, initial encounter: Secondary | ICD-10-CM | POA: Insufficient documentation

## 2017-04-05 DIAGNOSIS — Y929 Unspecified place or not applicable: Secondary | ICD-10-CM | POA: Diagnosis not present

## 2017-04-05 DIAGNOSIS — S299XXA Unspecified injury of thorax, initial encounter: Secondary | ICD-10-CM | POA: Diagnosis present

## 2017-04-05 DIAGNOSIS — X500XXA Overexertion from strenuous movement or load, initial encounter: Secondary | ICD-10-CM | POA: Insufficient documentation

## 2017-04-05 DIAGNOSIS — Y939 Activity, unspecified: Secondary | ICD-10-CM | POA: Insufficient documentation

## 2017-04-05 DIAGNOSIS — Y990 Civilian activity done for income or pay: Secondary | ICD-10-CM | POA: Diagnosis not present

## 2017-04-05 DIAGNOSIS — Z79899 Other long term (current) drug therapy: Secondary | ICD-10-CM | POA: Diagnosis not present

## 2017-04-05 MED ORDER — MELOXICAM 15 MG PO TABS: 15.0000 mg | ORAL_TABLET | Freq: Every day | ORAL | 0 refills | Status: AC

## 2017-04-05 MED ORDER — METHOCARBAMOL 500 MG PO TABS: 500.0000 mg | ORAL_TABLET | Freq: Four times a day (QID) | ORAL | 0 refills | Status: AC

## 2017-04-05 NOTE — ED Triage Notes (Signed)
Pt to ED via POV c/o lower back pain intermittently for the past 2 weeks. Pt states that she woke up this morning and the pain was worse. Pt states that she has not injured her back that she knows of but may have strained it at work lifting boxes. Pt denies any urinary symptoms. Pt in NAD at this time.

## 2017-04-05 NOTE — ED Notes (Signed)
Pt presents today with back pain that started 2 weeks ago. Pt says she has been taking OTC for the pain now she has no relief. PT is NAD.

## 2017-04-05 NOTE — ED Provider Notes (Signed)
[facility] Emergency Department Provider Note ____________________________________________  Time seen: Approximately 2:13 PM  I have reviewed the triage vital signs and the nursing notes.   HISTORY  Chief Complaint Back Pain    HPI Emily Martinez is a 31 y.o. female who presents to the emergency department for evaluation of back pain. Pain started approximately 2 weeks ago. She has been taking over-the-counter medications without any relief. She denies injury. Upon awakening this morning, pain was worse. She does lift boxes while at work, but does not recall any specific point of onset of pain. She denies any urinary symptoms.  Past Medical History:  Diagnosis Date  . Abscess unk    There are no active problems to display for this patient.   Past Surgical History:  Procedure Laterality Date  . TONSILLECTOMY      Prior to Admission medications   Medication Sig Start Date End Date Taking? Authorizing Provider  meloxicam (MOBIC) 15 MG tablet Take 1 tablet (15 mg total) daily by mouth. 04/05/17   Juliza Machnik B, FNP  methocarbamol (ROBAXIN) 500 MG tablet Take 1 tablet (500 mg total) 4 (four) times daily by mouth. 04/05/17   Maddeline Roorda B, FNP  omeprazole (PRILOSEC OTC) 20 MG tablet Take 1 tablet (20 mg total) by mouth daily. 02/08/15 02/08/16  Loleta RoseForbach, Cory, MD  ondansetron (ZOFRAN) 4 MG tablet Take 1-2 tabs by mouth every 8 hours as needed for nausea/vomiting 02/08/15   Loleta RoseForbach, Cory, MD  oxyCODONE-acetaminophen (PERCOCET) 7.5-325 MG per tablet Take 1 tablet by mouth every 6 (six) hours as needed for severe pain. 11/08/14   Joni ReiningSmith, Ronald K, PA-C  sulfamethoxazole-trimethoprim (BACTRIM DS,SEPTRA DS) 800-160 MG per tablet Take 1 tablet by mouth 2 (two) times daily. 11/08/14   Joni ReiningSmith, Ronald K, PA-C    Allergies Patient has no known allergies.  No family history on file.  Social History Social History   Tobacco Use  . Smoking status: Never Smoker   . Smokeless tobacco: Never Used  Substance Use Topics  . Alcohol use: No  . Drug use: Not on file    Review of Systems Constitutional: Well appearing. Cardiovascular: Negative for change in skin temperature or color. Respiratory: Negative for dyspnea. Musculoskeletal:   Negative for fecal incontinence,  Saddle anesthesia, or urinary retention  Negative for immunosuppression, IV drug use, or fever  Negative for chronic steroid use   Negative for trauma in the presence of osteoporosis  Negative for age over 8350 and trauma.  Negative for constitutional symptoms, or history of cancer   Negative for pain worse at night.  Negative for focal neurologic deficit, progressive, or disabling symptoms Skin: Negative for rash, lesion, or wound.  Neurological: Negative for burning, tingling, numb, electric, radiating pain..  ____________________________________________   PHYSICAL EXAM:  VITAL SIGNS: ED Triage Vitals  Enc Vitals Group     BP 04/05/17 1326 (!) 137/92     Pulse Rate 04/05/17 1326 81     Resp 04/05/17 1326 16     Temp 04/05/17 1326 98.6 F (37 C)     Temp Source 04/05/17 1326 Oral     SpO2 04/05/17 1326 100 %     Weight 04/05/17 1327 140 lb (63.5 kg)     Height 04/05/17 1327 5\' 2"  (1.575 m)     Head Circumference --      Peak Flow --      Pain Score 04/05/17 1326 7     Pain Loc --  Pain Edu? --      Excl. in GC? --     Constitutional: Alert and oriented. Well appearing and in no acute distress. Eyes: Conjunctivae are clear without discharge or drainage.  Head: Atraumatic. Neck: Full, active range of motion. Respiratory: Respirations even and unlabored. Musculoskeletal: Full ROM throughout, Strength 5/5 of the lower extremities as tested.  Neurologic: Reflexes of the lower extremities are 2+. Negative straight leg raise on both sides. Skin: Atraumatic.  Psychiatric: Behavior and affect are normal.  ____________________________________________   LABS (all  labs ordered are listed, but only abnormal results are displayed)  Labs Reviewed - No data to display ____________________________________________  RADIOLOGY  Not indicated ____________________________________________   PROCEDURES  Procedure(s) performed: None  ____________________________________________   INITIAL IMPRESSION / ASSESSMENT AND PLAN / ED COURSE  Emily Martinez is a 31 y.o. female who presents to the emergency department for evaluation and treatment of back pain. Symptoms and exam are consistent with trapezius muscle strain. She was encouraged to use heat or ice, whichever feels better. She was also instructed to take the Robaxin and meloxicam as prescribed. She was encouraged to follow up with the primary care provider of her choice for symptoms that are not improving over the week. She was instructed to return to the emergency department for symptoms that change or worsen if she is unable schedule an appointment.  Pertinent labs & imaging results that were available during my care of the patient were reviewed by me and considered in my medical decision making (see chart for details).  _________________________________________   FINAL CLINICAL IMPRESSION(S) / ED DIAGNOSES  Final diagnoses:  Trapezius muscle strain, right, initial encounter    If controlled substance prescribed during this visit, 12 month history viewed on the [facility] prior to issuing an initial prescription for Schedule II or III opiod.    Chinita Pesterriplett, Charleton Deyoung B, FNP 04/05/17 1519    Arnaldo NatalMalinda, Paul F, MD 04/05/17 1520

## 2017-04-05 NOTE — Discharge Instructions (Signed)
Please follow up with the primary care provider for choice for symptoms that are not improving over the week. Return to the emergency department for symptoms that change or worsen if you're unable to schedule an appointment.

## 2017-04-05 NOTE — ED Notes (Signed)
Pt discharged to home.  Family member driving.  Discharge instructions reviewed.  Verbalized understanding.  No questions or concerns at this time.  Teach back verified.  Pt in NAD.  No items left in ED.   

## 2018-04-30 IMAGING — US US TRANSVAGINAL NON-OB
1 series · 14 of 25 positions shown · non-contrast
Comparison: None

CLINICAL DATA: Vaginal bleeding for 16 days.



[Series 1: us transvaginal non-ob · 0.20mm/px · 14 of 61 slices shown]
[im 1/61]
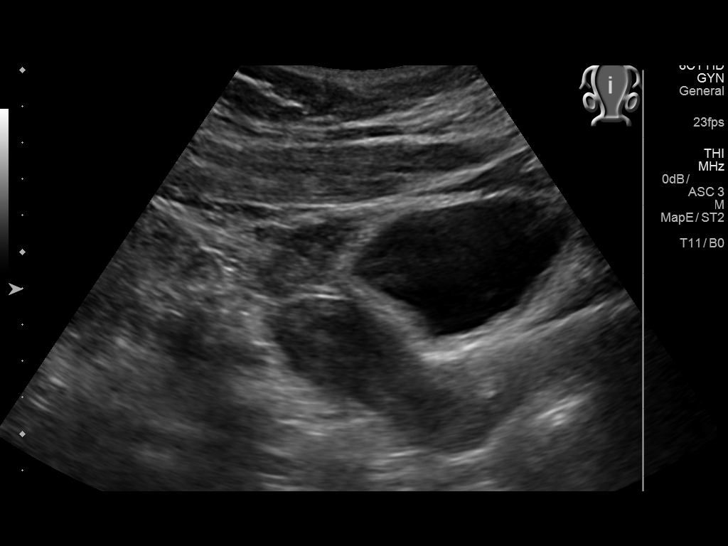
[im 6/61]
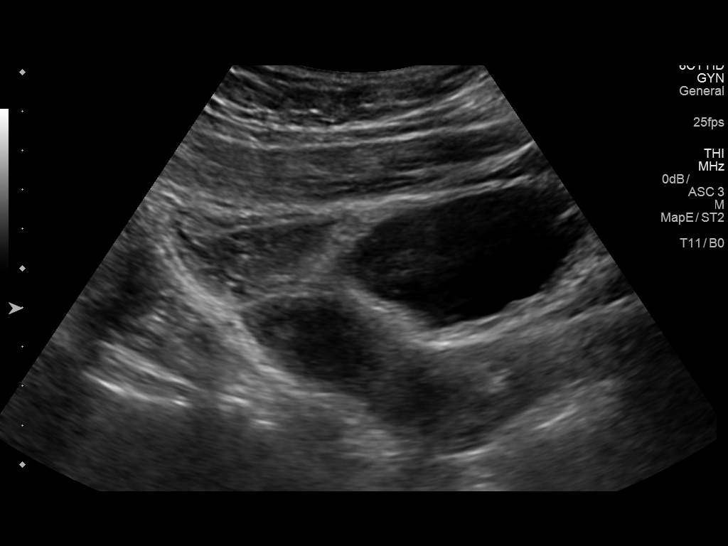
[im 11/61]
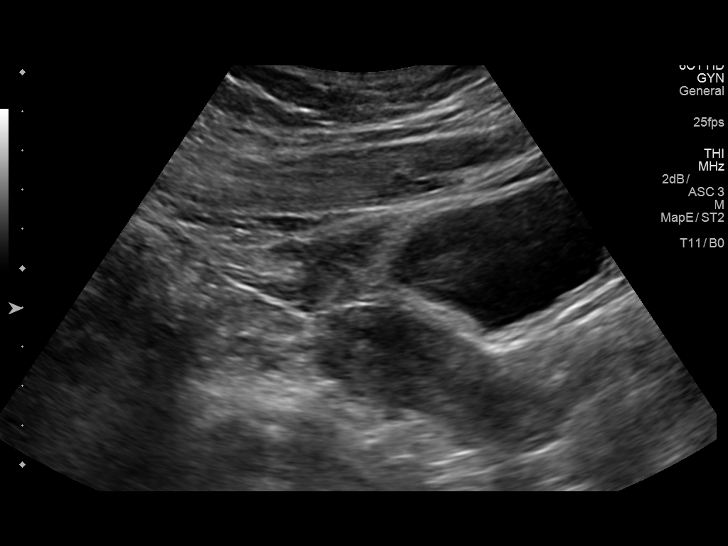
[im 16/61]
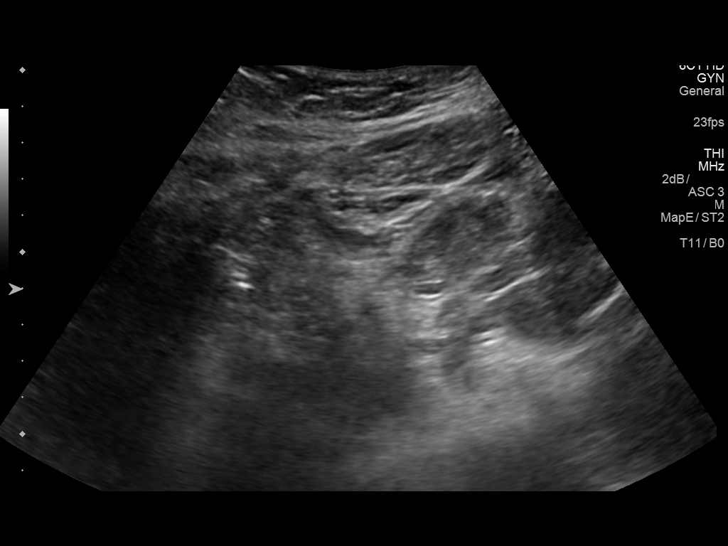
[im 21/61]
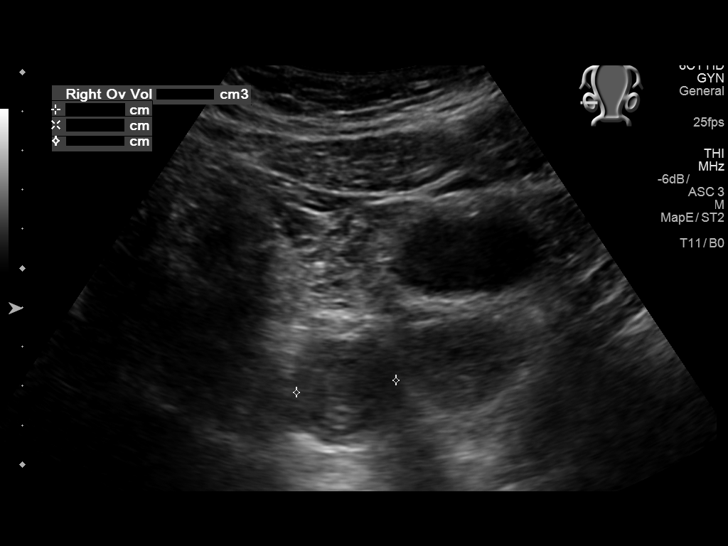
[im 23/61]
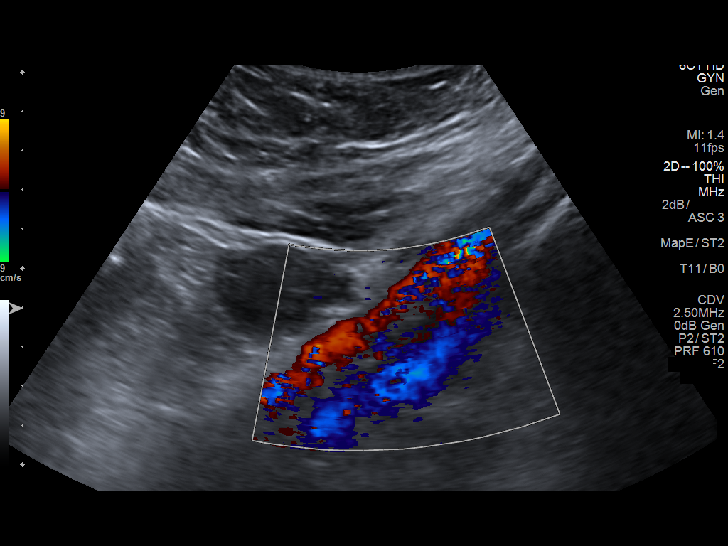
[im 28/61]
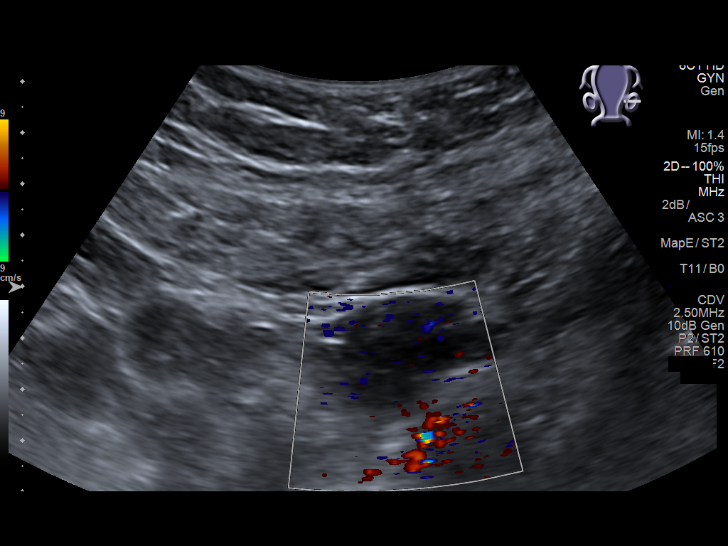
[im 33/61]
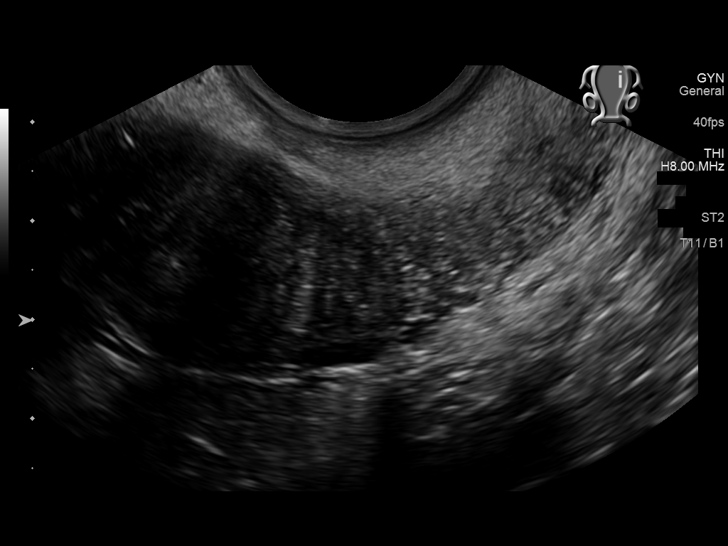
[im 38/61]
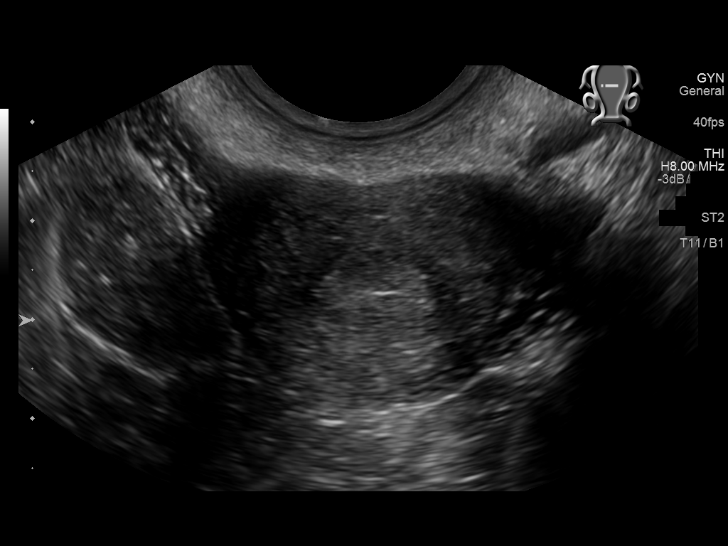
[im 41/61]
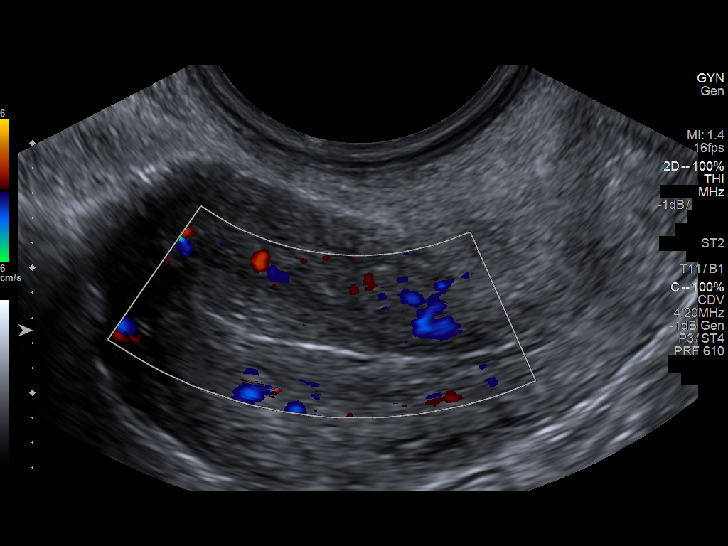
[im 46/61]
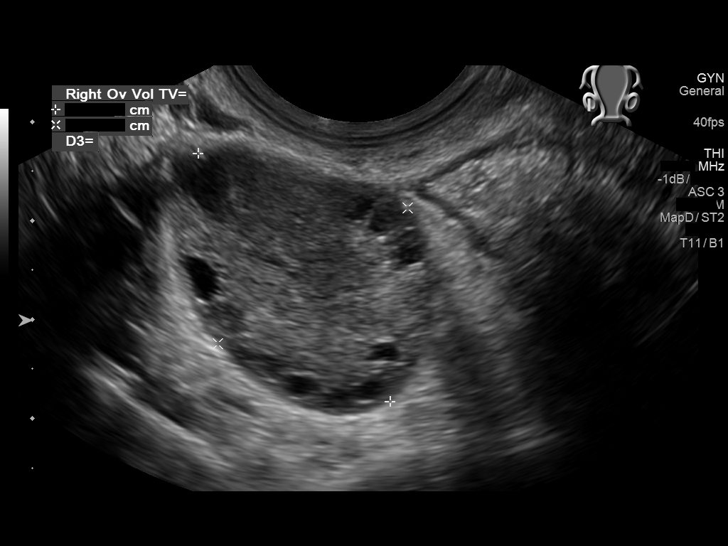
[im 51/61]
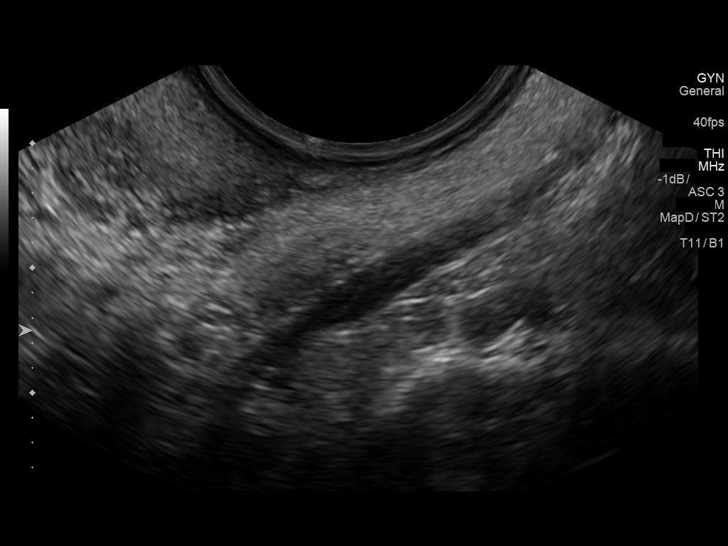
[im 56/61]
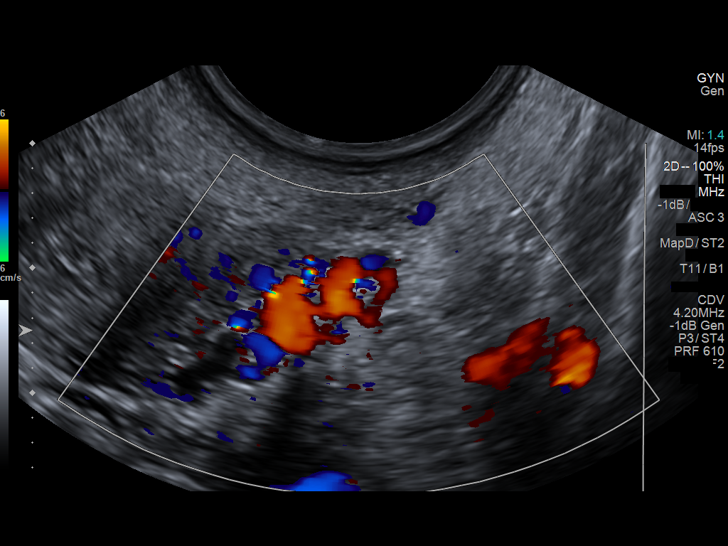
[im 61/61]
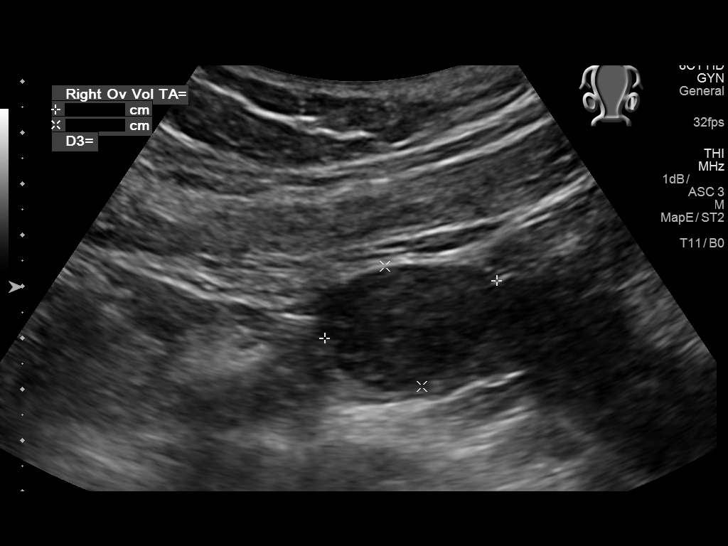

[14 of 25 positions shown; findings below may reference images not displayed]

FINDINGS: Uterus

Measurements: 6.6 x 2.4 x 2.9 cm. No fibroids or other mass
visualized.

Endometrium

Thickness: 6 mm.  No focal abnormality visualized.

Right ovary

Measurements: 3.2 x 2.3 x 2.6 cm. Normal appearance/no adnexal mass.

Left ovary

Measurements: 3.5 x 2.4 x 2.6 cm. Normal appearance/no adnexal mass.

Other findings

No abnormal free fluid.
IMPRESSION: Unremarkable pelvic ultrasound.

## 2018-07-10 ENCOUNTER — Ambulatory Visit: Payer: 59 | Admitting: Obstetrics and Gynecology
